# Patient Record
Sex: Male | Born: 1960 | Race: White | Hispanic: No | Marital: Married | Smoking: Never smoker
Health system: Southern US, Community
[De-identification: ages and names within clinical notes are randomized; demographics above are authoritative.]

## PROBLEM LIST (undated history)

## (undated) DIAGNOSIS — E7800 Pure hypercholesterolemia, unspecified: Secondary | ICD-10-CM

## (undated) DIAGNOSIS — G4730 Sleep apnea, unspecified: Secondary | ICD-10-CM

## (undated) DIAGNOSIS — M51369 Other intervertebral disc degeneration, lumbar region without mention of lumbar back pain or lower extremity pain: Secondary | ICD-10-CM

## (undated) DIAGNOSIS — M5136 Other intervertebral disc degeneration, lumbar region: Secondary | ICD-10-CM

## (undated) HISTORY — PX: OTHER SURGICAL HISTORY: SHX169

---

## 2013-02-27 ENCOUNTER — Ambulatory Visit: Payer: Self-pay | Admitting: Sports Medicine

## 2016-06-17 ENCOUNTER — Other Ambulatory Visit: Payer: Self-pay | Admitting: Neurology

## 2016-06-17 DIAGNOSIS — R270 Ataxia, unspecified: Secondary | ICD-10-CM

## 2016-07-08 ENCOUNTER — Ambulatory Visit
Admission: RE | Admit: 2016-07-08 | Discharge: 2016-07-08 | Disposition: A | Discharge status: Home / Self Care | Payer: BC Managed Care – PPO | Source: Ambulatory Visit | Attending: Neurology | Admitting: Neurology

## 2016-07-08 DIAGNOSIS — R270 Ataxia, unspecified: Secondary | ICD-10-CM | POA: Diagnosis not present

## 2016-07-08 DIAGNOSIS — M2578 Osteophyte, vertebrae: Secondary | ICD-10-CM | POA: Diagnosis not present

## 2016-07-08 DIAGNOSIS — R200 Anesthesia of skin: Secondary | ICD-10-CM | POA: Diagnosis present

## 2016-07-08 MED ORDER — GADOBENATE DIMEGLUMINE 529 MG/ML IV SOLN
20.0000 mL | Freq: Once | INTRAVENOUS | Status: AC | PRN
  Administered 2016-07-08: 20 mL via INTRAVENOUS

## 2020-03-24 ENCOUNTER — Ambulatory Visit: Payer: BC Managed Care – PPO | Attending: Internal Medicine

## 2020-03-24 ENCOUNTER — Other Ambulatory Visit: Payer: Self-pay

## 2020-03-24 DIAGNOSIS — Z23 Encounter for immunization: Secondary | ICD-10-CM

## 2020-03-24 NOTE — Progress Notes (Signed)
   Covid-19 Vaccination Clinic  Name:  Darren Reyes    MRN: [id number] DOB: 10-23-1961  03/24/2020  Mr. Darren Reyes was observed post Covid-19 immunization for 15 minutes without incident. He was provided with Vaccine Information Sheet and instruction to access the V-Safe system.   Mr. Darren Reyes was instructed to call 911 with any severe reactions post vaccine: Marland Kitchen Difficulty breathing  . Swelling of face and throat  . A fast heartbeat  . A bad rash all over body  . Dizziness and weakness   Immunizations Administered    Name Date Dose VIS Date Route   Pfizer COVID-19 Vaccine 03/24/2020 12:05 PM 0.3 mL 12/07/2019 Intramuscular   Manufacturer: [place]   Lot: [id number]   [place]: [id number]

## 2020-04-16 ENCOUNTER — Ambulatory Visit: Payer: BC Managed Care – PPO | Attending: Internal Medicine

## 2020-04-16 DIAGNOSIS — Z23 Encounter for immunization: Secondary | ICD-10-CM

## 2020-04-16 NOTE — Progress Notes (Signed)
   Covid-19 Vaccination Clinic  Name:  DANIE MAGNONE    MRN: [id number] DOB: May 16, 1961  04/16/2020  Mr. Brickler was observed post Covid-19 immunization for 15 minutes without incident. He was provided with Vaccine Information Sheet and instruction to access the V-Safe system.   Mr. Eustace was instructed to call 911 with any severe reactions post vaccine: Marland Kitchen Difficulty breathing  . Swelling of face and throat  . A fast heartbeat  . A bad rash all over body  . Dizziness and weakness   Immunizations Administered    Name Date Dose VIS Date Route   Pfizer COVID-19 Vaccine 04/16/2020  1:16 PM 0.3 mL 02/20/2019 Intramuscular   Manufacturer: [place]   Lot: [id number]   [place]: [id number]

## 2021-02-04 ENCOUNTER — Other Ambulatory Visit: Payer: Self-pay

## 2021-02-04 ENCOUNTER — Ambulatory Visit: Payer: BC Managed Care – PPO | Admitting: Dermatology

## 2021-02-04 DIAGNOSIS — L738 Other specified follicular disorders: Secondary | ICD-10-CM

## 2021-02-04 DIAGNOSIS — Z1283 Encounter for screening for malignant neoplasm of skin: Secondary | ICD-10-CM

## 2021-02-04 DIAGNOSIS — D229 Melanocytic nevi, unspecified: Secondary | ICD-10-CM

## 2021-02-04 DIAGNOSIS — L820 Inflamed seborrheic keratosis: Secondary | ICD-10-CM | POA: Diagnosis not present

## 2021-02-04 DIAGNOSIS — L821 Other seborrheic keratosis: Secondary | ICD-10-CM | POA: Diagnosis not present

## 2021-02-04 DIAGNOSIS — L578 Other skin changes due to chronic exposure to nonionizing radiation: Secondary | ICD-10-CM | POA: Diagnosis not present

## 2021-02-04 DIAGNOSIS — L814 Other melanin hyperpigmentation: Secondary | ICD-10-CM | POA: Diagnosis not present

## 2021-02-04 DIAGNOSIS — D1800 Hemangioma unspecified site: Secondary | ICD-10-CM

## 2021-02-04 DIAGNOSIS — L570 Actinic keratosis: Secondary | ICD-10-CM

## 2021-02-04 NOTE — Progress Notes (Signed)
   Follow-Up Visit   Subjective  Darren Reyes is a 60 y.o. male who presents for the following: Total body skin exam (No hx of skin ca, 1 yr f/u) and check spot (L cheek, 35m, irritated when shaving). The patient presents for Total-Body Skin Exam (TBSE) for skin cancer screening and mole check.  The following portions of the chart were reviewed this encounter and updated as appropriate:   Allergies  Meds  Problems  Med Hx  Surg Hx  Fam Hx     Review of Systems:  No other skin or systemic complaints except as noted in HPI or Assessment and Plan.  Objective  Well appearing patient in no apparent distress; mood and affect are within normal limits.  A full examination was performed including scalp, head, eyes, ears, nose, lips, neck, chest, axillae, abdomen, back, buttocks, bilateral upper extremities, bilateral lower extremities, hands, feet, fingers, toes, fingernails, and toenails. All findings within normal limits unless otherwise noted below.  Objective  L cheek x 1: Pink scaly macules   Objective  L sideburn x 1: Erythematous keratotic or waxy stuck-on papule or plaque.    Assessment & Plan    Lentigines - Scattered tan macules - Discussed due to sun exposure - Benign, observe - Call for any changes  Seborrheic Keratoses - Stuck-on, waxy, tan-brown papules and plaques  - Discussed benign etiology and prognosis. - Observe - Call for any changes  Melanocytic Nevi - Tan-brown and/or pink-flesh-colored symmetric macules and papules - Benign appearing on exam today - Observation - Call clinic for new or changing moles - Recommend daily use of broad spectrum spf 30+ sunscreen to sun-exposed areas.   Hemangiomas - Red papules - Discussed benign nature - Observe - Call for any changes  Actinic Damage - Chronic, secondary to cumulative UV/sun exposure - diffuse scaly erythematous macules with underlying dyspigmentation - Recommend daily broad spectrum sunscreen  SPF 30+ to sun-exposed areas, reapply every 2 hours as needed.  - Call for new or changing lesions.  Skin cancer screening performed today.  Sebaceous Hyperplasia - Small yellow papules with a central dell - Benign - Observe  AK (actinic keratosis) L cheek x 1  Destruction of lesion - L cheek x 1 Complexity: simple   Destruction method: cryotherapy   Informed consent: discussed and consent obtained   Timeout:  patient name, date of birth, surgical site, and procedure verified Lesion destroyed using liquid nitrogen: Yes   Region frozen until ice ball extended beyond lesion: Yes   Outcome: patient tolerated procedure well with no complications   Post-procedure details: wound care instructions given    Inflamed seborrheic keratosis L sideburn x 1  Destruction of lesion - L sideburn x 1 Complexity: simple   Destruction method: cryotherapy   Informed consent: discussed and consent obtained   Timeout:  patient name, date of birth, surgical site, and procedure verified Lesion destroyed using liquid nitrogen: Yes   Region frozen until ice ball extended beyond lesion: Yes   Outcome: patient tolerated procedure well with no complications   Post-procedure details: wound care instructions given    Skin cancer screening  Return in about 1 year (around 02/04/2022) for TBSE, hx of AK.  I, Othelia Pulling, RMA, am acting as scribe for Sarina Ser, MD .  Documentation: I have reviewed the above documentation for accuracy and completeness, and I agree with the above.  Sarina Ser, MD

## 2021-02-07 ENCOUNTER — Encounter: Payer: Self-pay | Admitting: Dermatology

## 2021-02-14 ENCOUNTER — Emergency Department: Payer: BC Managed Care – PPO

## 2021-02-14 ENCOUNTER — Encounter: Payer: Self-pay | Admitting: Intensive Care

## 2021-02-14 ENCOUNTER — Emergency Department
Admission: EM | Admit: 2021-02-14 | Discharge: 2021-02-14 | Disposition: A | Discharge status: Home / Self Care | Payer: BC Managed Care – PPO | Attending: Emergency Medicine | Admitting: Emergency Medicine

## 2021-02-14 ENCOUNTER — Other Ambulatory Visit: Payer: Self-pay

## 2021-02-14 DIAGNOSIS — M47817 Spondylosis without myelopathy or radiculopathy, lumbosacral region: Secondary | ICD-10-CM

## 2021-02-14 DIAGNOSIS — Y93F2 Activity, caregiving, lifting: Secondary | ICD-10-CM | POA: Insufficient documentation

## 2021-02-14 DIAGNOSIS — M5136 Other intervertebral disc degeneration, lumbar region: Secondary | ICD-10-CM | POA: Diagnosis not present

## 2021-02-14 DIAGNOSIS — S39012A Strain of muscle, fascia and tendon of lower back, initial encounter: Secondary | ICD-10-CM | POA: Insufficient documentation

## 2021-02-14 DIAGNOSIS — X500XXA Overexertion from strenuous movement or load, initial encounter: Secondary | ICD-10-CM | POA: Diagnosis not present

## 2021-02-14 DIAGNOSIS — S3992XA Unspecified injury of lower back, initial encounter: Secondary | ICD-10-CM | POA: Diagnosis present

## 2021-02-14 DIAGNOSIS — M5137 Other intervertebral disc degeneration, lumbosacral region: Secondary | ICD-10-CM

## 2021-02-14 DIAGNOSIS — M4696 Unspecified inflammatory spondylopathy, lumbar region: Secondary | ICD-10-CM | POA: Insufficient documentation

## 2021-02-14 MED ORDER — OXYCODONE-ACETAMINOPHEN 5-325 MG PO TABS
1.0000 | ORAL_TABLET | Freq: Once | ORAL | Status: AC
  Administered 2021-02-14: 1 via ORAL
  Filled 2021-02-14: qty 1

## 2021-02-14 MED ORDER — KETOROLAC TROMETHAMINE 10 MG PO TABS: 10.0000 mg | ORAL_TABLET | Freq: Three times a day (TID) | ORAL | 0 refills | Status: AC

## 2021-02-14 MED ORDER — ORPHENADRINE CITRATE 30 MG/ML IJ SOLN
60.0000 mg | INTRAMUSCULAR | Status: AC
  Administered 2021-02-14: 60 mg via INTRAMUSCULAR
  Filled 2021-02-14: qty 2

## 2021-02-14 MED ORDER — KETOROLAC TROMETHAMINE 30 MG/ML IJ SOLN
30.0000 mg | Freq: Once | INTRAMUSCULAR | Status: AC
  Administered 2021-02-14: 30 mg via INTRAMUSCULAR
  Filled 2021-02-14: qty 1

## 2021-02-14 MED ORDER — LIDOCAINE 5 % EX PTCH
1.0000 | MEDICATED_PATCH | Freq: Once | CUTANEOUS | Status: DC
  Administered 2021-02-14: 1 via TRANSDERMAL
  Filled 2021-02-14: qty 1

## 2021-02-14 MED ORDER — HYDROCODONE-ACETAMINOPHEN 5-325 MG PO TABS: 1.0000 | ORAL_TABLET | Freq: Three times a day (TID) | ORAL | 0 refills | Status: AC | PRN

## 2021-02-14 MED ORDER — CYCLOBENZAPRINE HCL 5 MG PO TABS: 5.0000 mg | ORAL_TABLET | Freq: Three times a day (TID) | ORAL | 0 refills | Status: AC | PRN

## 2021-02-14 MED ORDER — LIDOCAINE 5 % EX PTCH: 1.0000 | MEDICATED_PATCH | Freq: Two times a day (BID) | CUTANEOUS | 0 refills | Status: AC | PRN

## 2021-02-14 NOTE — ED Triage Notes (Signed)
Patient presents with lower back pain that started yesterday after hurting back lifting weights. Reports wife drove him to ER

## 2021-02-14 NOTE — ED Notes (Signed)
Pt to ED via POV c/o severe back pain that started yesterday after was lifting weights. Pt states that walking causes excruciating pain and does not want to get out of wheelchair.

## 2021-02-14 NOTE — ED Provider Notes (Signed)
[facility] Emergency Department Provider Note ____________________________________________  Time seen: 89  I have reviewed the triage vital signs and the nursing notes.  HISTORY  Chief Complaint  Back Pain  HPI Darren Reyes is a 61 y.o. male presents to the ED accompanied by his wife, for evaluation of acute midline low back pain.  Patient with a remote history of DDD and facet arthritis, presents after he was in the gym yesterday.  He describes doing chest presses with dumbbells on a bench, when he sat up to put the weights down, he had an immediate pain to the midline of the lower back.  He denies any referral of his pain, or any saddle anesthesias, foot drop, or bladder or bowel incontinence.  He completed a another rep  of the chest presses, before retiring home.  He presents today after increased midline low back pain and disability.  He describes pain is increased with attempts to stand up straight and extend lower back.  He again denies any distal paresthesias.  Patient is taken some ibuprofen with limited benefit.  He is concerned given his previous history of degenerative disc disease.  History reviewed. No pertinent past medical history.  There are no problems to display for this patient.  History reviewed. No pertinent surgical history.  Prior to Admission medications   Medication Sig Start Date End Date Taking? Authorizing Provider  cyclobenzaprine (FLEXERIL) 5 MG tablet Take 1 tablet (5 mg total) by mouth 3 (three) times daily as needed. 02/14/21  Yes Decorian Schuenemann, Dannielle Karvonen, PA-C  HYDROcodone-acetaminophen (NORCO) 5-325 MG tablet Take 1 tablet by mouth 3 (three) times daily as needed for up to 5 days. 02/14/21 02/19/21 Yes Alizea Pell, Dannielle Karvonen, PA-C  ketorolac (TORADOL) 10 MG tablet Take 1 tablet (10 mg total) by mouth every 8 (eight) hours. 02/14/21  Yes Betha Shadix, Dannielle Karvonen, PA-C  lidocaine (LIDODERM) 5 % Place 1 patch onto the skin every 12  (twelve) hours as needed for up to 10 days. Remove & Discard patch after 12 hours of wear each day. 02/14/21 02/24/21 Yes Shakendra Griffeth, Dannielle Karvonen, PA-C    Allergies Patient has no known allergies.  History reviewed. No pertinent family history.  Social History Social History   Tobacco Use  . Smoking status: Never Smoker  . Smokeless tobacco: Current User    Types: Snuff  Substance Use Topics  . Alcohol use: Yes  . Drug use: Never    Review of Systems  Constitutional: Negative for fever. Eyes: Negative for visual changes. ENT: Negative for sore throat. Cardiovascular: Negative for chest pain. Respiratory: Negative for shortness of breath. Gastrointestinal: Negative for abdominal pain, vomiting and diarrhea. Genitourinary: Negative for dysuria. Musculoskeletal: Positive for back pain. Skin: Negative for rash. Neurological: Negative for headaches, focal weakness or numbness. ____________________________________________  PHYSICAL EXAM:  VITAL SIGNS: ED Triage Vitals  Enc Vitals Group     BP 02/14/21 1753 136/87     Pulse Rate 02/14/21 1753 73     Resp 02/14/21 1753 16     Temp 02/14/21 1753 97.6 F (36.4 C)     Temp Source 02/14/21 1753 Oral     SpO2 02/14/21 1753 100 %     Weight 02/14/21 1751 187 lb (84.8 kg)     Height 02/14/21 1751 6\' 1"  (1.854 m)     Head Circumference --      Peak Flow --      Pain Score 02/14/21 1751 10  Pain Loc --      Pain Edu? --      Excl. in [place]? --     Constitutional: Alert and oriented. Well appearing and in no distress. Head: Normocephalic and atraumatic. Eyes: Conjunctivae are normal. Normal extraocular movements Cardiovascular: Normal rate, regular rhythm. Normal distal pulses. Respiratory: Normal respiratory effort. No wheezes/rales/rhonchi. Gastrointestinal: Soft and nontender. No distention. Musculoskeletal: Spinal alignment without significant midline tenderness, spasm, vomiting, or step-off.  Patient is tender to  palpation to the lumbar sacral junction in the midline.  Nontender with normal range of motion in all extremities.  Neurologic: Cranial nerves II through XII grossly intact.  Normal LE DTRs bilaterally.  Normal toe dorsiflexion and foot eversion on exam.  Negative supine straight leg raise on the right.  Normal gait without ataxia. Normal speech and language. No gross focal neurologic deficits are appreciated. Skin:  Skin is warm, dry and intact. No rash noted. Psychiatric: Mood and affect are normal. Patient exhibits appropriate insight and judgment. ____________________________________________   RADIOLOGY  CT Lumbar Spine w/o CM  IMPRESSION: 1. No acute or traumatic finding. 2. Lower lumbar degenerative disc disease and degenerative facet disease. Findings are most pronounced at L4-5 where there is narrowing of the lateral recesses that could possibly cause neural compression. The facet arthropathy is most pronounced at this level, which could contribute to back pain or referred facet syndrome pain. There could also be anterolisthesis with standing or flexion. 3. The facet arthropathy is also potentially significant particularly at L3-4 and possibly at L2-3 and L5-S1. ____________________________________________  PROCEDURES  Toradol 60 mg IM Norflex 60 mg IM Percocet 5-325 mg PO Lidoderm 5% patch  Procedures ____________________________________________  INITIAL IMPRESSION / ASSESSMENT AND PLAN / ED COURSE  DDX: lumbar strain, HNP, radiculopathy, facet arthropathy  Patient with a remote history of lumbar radiculopathy, presents after acute injury to his low back.  He was concern for worsening of his underlying DDD.  CT scans revealed multilevel DDD and facet arthropathy, with some mild spinal stenosis at the L4-5 region.  Patient otherwise with a benign exam without signs of any acute neuromuscular deficit, or any red flags.  His pain is localized to the midline without referral  or radiculopathy.  Patient reports improvement of his pain after ED medication administration.  He is advised to follow-up with his primary provider or with neurology for ongoing symptoms.  Prescriptions for ketorolac, Flexeril, hydrocodone, and Lidoderm patches are provided.  He is referred to [place] neurosurgery, at his request, for ongoing management.   Darren Reyes was evaluated in Emergency Department on 02/15/2021 for the symptoms described in the history of present illness. He was evaluated in the context of the global COVID-19 pandemic, which necessitated consideration that the patient might be at risk for infection with the SARS-CoV-2 virus that causes COVID-19. Institutional protocols and algorithms that pertain to the evaluation of patients at risk for COVID-19 are in a state of rapid change based on information released by regulatory bodies including the CDC and federal and state organizations. These policies and algorithms were followed during the patient's care in the ED.  I reviewed the patient's prescription history over the last 12 months in the multi-state controlled substances database(s) that includes [place], [place], [place], [place], [place], [place], [place], [place], [place], [place], [place], [place], [place], and [place].  Results were notable for neurosurgery for ongoing management. ____________________________________________  FINAL CLINICAL IMPRESSION(S) / ED DIAGNOSES  Final diagnoses:  Strain of lumbar region,  initial encounter  DDD (degenerative disc disease), lumbosacral  Facet arthropathy, lumbosacral      Jhoan Schmieder, Dannielle Karvonen, PA-C 02/15/21 0010    Blake Divine, MD 02/15/21 [phone]

## 2021-02-14 NOTE — Discharge Instructions (Addendum)
Your exam and CT scan are consistent with moderate multilevel degenerative disc disease and facet arthropathy.  Take the prescription medications as provided.  Follow-up with Dr. Arnoldo Morale or your PCP for ongoing symptoms peer return to the ED if needed.

## 2021-02-23 ENCOUNTER — Other Ambulatory Visit: Payer: Self-pay | Admitting: Orthopedic Surgery

## 2021-02-23 DIAGNOSIS — M25512 Pain in left shoulder: Secondary | ICD-10-CM

## 2021-02-28 ENCOUNTER — Ambulatory Visit
Admission: RE | Admit: 2021-02-28 | Discharge: 2021-02-28 | Disposition: A | Discharge status: Home / Self Care | Payer: BC Managed Care – PPO | Source: Ambulatory Visit | Attending: Orthopedic Surgery | Admitting: Orthopedic Surgery

## 2021-02-28 ENCOUNTER — Other Ambulatory Visit: Payer: Self-pay

## 2021-02-28 DIAGNOSIS — M25512 Pain in left shoulder: Secondary | ICD-10-CM

## 2022-02-10 ENCOUNTER — Other Ambulatory Visit: Payer: Self-pay

## 2022-02-10 ENCOUNTER — Ambulatory Visit: Payer: BC Managed Care – PPO | Admitting: Dermatology

## 2022-02-10 DIAGNOSIS — D1800 Hemangioma unspecified site: Secondary | ICD-10-CM

## 2022-02-10 DIAGNOSIS — Z1283 Encounter for screening for malignant neoplasm of skin: Secondary | ICD-10-CM | POA: Diagnosis not present

## 2022-02-10 DIAGNOSIS — D492 Neoplasm of unspecified behavior of bone, soft tissue, and skin: Secondary | ICD-10-CM

## 2022-02-10 DIAGNOSIS — D229 Melanocytic nevi, unspecified: Secondary | ICD-10-CM

## 2022-02-10 DIAGNOSIS — L820 Inflamed seborrheic keratosis: Secondary | ICD-10-CM | POA: Diagnosis not present

## 2022-02-10 DIAGNOSIS — L821 Other seborrheic keratosis: Secondary | ICD-10-CM

## 2022-02-10 DIAGNOSIS — L578 Other skin changes due to chronic exposure to nonionizing radiation: Secondary | ICD-10-CM

## 2022-02-10 DIAGNOSIS — C4491 Basal cell carcinoma of skin, unspecified: Secondary | ICD-10-CM

## 2022-02-10 DIAGNOSIS — L814 Other melanin hyperpigmentation: Secondary | ICD-10-CM

## 2022-02-10 HISTORY — DX: Basal cell carcinoma of skin, unspecified: C44.91

## 2022-02-10 NOTE — Patient Instructions (Addendum)

## 2022-02-10 NOTE — Progress Notes (Signed)
Follow-Up Visit   Subjective  Darren Reyes is a 61 y.o. male who presents for the following: Annual Exam (Mole check ). The patient presents for Total-Body Skin Exam (TBSE) for skin cancer screening and mole check.  The patient has spots, moles and lesions to be evaluated, some may be new or changing and the patient has concerns that these could be cancer.   The following portions of the chart were reviewed this encounter and updated as appropriate:   Tobacco   Allergies   Meds   Problems   Med Hx   Surg Hx   Fam Hx      Review of Systems:  No other skin or systemic complaints except as noted in HPI or Assessment and Plan.  Objective  Well appearing patient in no apparent distress; mood and affect are within normal limits.  A full examination was performed including scalp, head, eyes, ears, nose, lips, neck, chest, axillae, abdomen, back, buttocks, bilateral upper extremities, bilateral lower extremities, hands, feet, fingers, toes, fingernails, and toenails. All findings within normal limits unless otherwise noted below.  right lower eyelid margin x 2 (2) Stuck-on, waxy, tan-brown papule   right cheek preauricular 0.8 cm crusted papule      left forearm x 1 Stuck-on, waxy, tan-brown papule   Assessment & Plan  Inflamed seborrheic keratosis (2) right lower eyelid margin x 2  Destruction of lesion - right lower eyelid margin x 2 Complexity: simple   Destruction method: cryotherapy   Informed consent: discussed and consent obtained   Timeout:  patient name, date of birth, surgical site, and procedure verified Lesion destroyed using liquid nitrogen: Yes   Region frozen until ice ball extended beyond lesion: Yes   Outcome: patient tolerated procedure well with no complications   Post-procedure details: wound care instructions given    Neoplasm of skin right cheek preauricular  Skin / nail biopsy Type of biopsy: tangential   Informed consent: discussed and consent  obtained   Patient was prepped and draped in usual sterile fashion: Area prepped with alcohol. Anesthesia: the lesion was anesthetized in a standard fashion   Anesthetic:  1% lidocaine w/ epinephrine 1-100,000 buffered w/ 8.4% NaHCO3 Instrument used: flexible razor blade   Hemostasis achieved with: pressure, aluminum chloride and electrodesiccation   Outcome: patient tolerated procedure well   Post-procedure details: wound care instructions given   Post-procedure details comment:  Ointment and small bandage applied  Specimen 1 - Surgical pathology Differential Diagnosis: R/O BCC  Check Margins: No  Recommend surgery if biopsy confirm skin cancer   Seborrheic keratosis, inflamed left forearm x 1  Reassured benign age-related growth.  Recommend observation.  Discussed cryotherapy if spot(s) become irritated or inflamed.   Destruction of lesion - left forearm x 1 Complexity: simple   Destruction method: cryotherapy   Informed consent: discussed and consent obtained   Timeout:  patient name, date of birth, surgical site, and procedure verified Lesion destroyed using liquid nitrogen: Yes   Region frozen until ice ball extended beyond lesion: Yes   Outcome: patient tolerated procedure well with no complications   Post-procedure details: wound care instructions given    Skin cancer screening  Lentigines - Scattered tan macules - Due to sun exposure - Benign-appearing, observe - Recommend daily broad spectrum sunscreen SPF 30+ to sun-exposed areas, reapply every 2 hours as needed. - Call for any changes  Seborrheic Keratoses - Stuck-on, waxy, tan-brown papules and/or plaques  - Benign-appearing - Discussed benign etiology  and prognosis. - Observe - Call for any changes  Melanocytic Nevi - Tan-brown and/or pink-flesh-colored symmetric macules and papules - Benign appearing on exam today - Observation - Call clinic for new or changing moles - Recommend daily use of broad  spectrum spf 30+ sunscreen to sun-exposed areas.   Hemangiomas - Red papules - Discussed benign nature - Observe - Call for any changes  Actinic Damage - Chronic condition, secondary to cumulative UV/sun exposure - diffuse scaly erythematous macules with underlying dyspigmentation - Recommend daily broad spectrum sunscreen SPF 30+ to sun-exposed areas, reapply every 2 hours as needed.  - Staying in the shade or wearing long sleeves, sun glasses (UVA+UVB protection) and wide brim hats (4-inch brim around the entire circumference of the hat) are also recommended for sun protection.  - Call for new or changing lesions.  Skin cancer screening performed today.   Return in about 1 year (around 02/10/2023) for TBSE, hx of Aks .  IMarye Round, CMA, am acting as scribe for Sarina Ser, MD .  Documentation: I have reviewed the above documentation for accuracy and completeness, and I agree with the above.  Sarina Ser, MD

## 2022-02-11 ENCOUNTER — Encounter: Payer: Self-pay | Admitting: Gastroenterology

## 2022-02-11 NOTE — H&P (Signed)
Pre-Procedure H&P   Patient ID: Darren Reyes is a 61 y.o. male.  Gastroenterology Provider: Annamaria Helling, DO  Referring Provider: Dr. Kary Kos PCP: [place] Pink, MD  Date: 02/12/2022  HPI Darren Reyes is a 61 y.o. male who presents today for being right for screening colonoscopy.  Patient with normal bowel movements without melena hematochezia diarrhea or constipation.  Colonoscopy in 2012 only demonstrating left-sided diverticulosis and hyperplastic polyps.  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  Current smokeless tobacco user.  Most recent labs hemoglobin 15.5 MCV 94 platelets 222,000 creatinine 1.1.   Past Medical History:  Diagnosis Date   DDD (degenerative disc disease), lumbar    Hypercholesteremia     Past Surgical History:  Procedure Laterality Date   left shoulder surgery Left    replacement    Family History No h/o GI disease or malignancy  Review of Systems  Constitutional:  Negative for activity change, appetite change, chills, diaphoresis, fatigue, fever and unexpected weight change.  HENT:  Negative for trouble swallowing and voice change.   Respiratory:  Negative for shortness of breath and wheezing.   Cardiovascular:  Negative for chest pain, palpitations and leg swelling.  Gastrointestinal:  Negative for abdominal distention, abdominal pain, anal bleeding, blood in stool, constipation, diarrhea, nausea and vomiting.  Musculoskeletal:  Negative for arthralgias and myalgias.  Skin:  Negative for color change and pallor.  Neurological:  Negative for dizziness, syncope and weakness.  Psychiatric/Behavioral:  Negative for confusion. The patient is not nervous/anxious.   All other systems reviewed and are negative.   Medications No current facility-administered medications on file prior to encounter.   Current Outpatient Medications on File Prior to Encounter  Medication Sig Dispense Refill   cyclobenzaprine  (FLEXERIL) 5 MG tablet Take 1 tablet (5 mg total) by mouth 3 (three) times daily as needed. (Patient not taking: Reported on 02/12/2022) 15 tablet 0   ketorolac (TORADOL) 10 MG tablet Take 1 tablet (10 mg total) by mouth every 8 (eight) hours. (Patient not taking: Reported on 02/12/2022) 15 tablet 0    Pertinent medications related to GI and procedure were reviewed by me with the patient prior to the procedure   Current Facility-Administered Medications:    0.9 %  sodium chloride infusion, , Intravenous, Continuous, Annamaria Helling, DO      No Known Allergies Allergies were reviewed by me prior to the procedure  Objective    Vitals:   02/12/22 1116  BP: 111/69  Pulse: 81  Resp: 20  Temp: 97.7 F (36.5 C)  TempSrc: Temporal  SpO2: 100%  Weight: 88 kg  Height: 6\' 1"  (1.854 m)     Physical Exam Vitals and nursing note reviewed.  Constitutional:      General: He is not in acute distress.    Appearance: Normal appearance. He is not ill-appearing, toxic-appearing or diaphoretic.  HENT:     Head: Normocephalic and atraumatic.     Nose: Nose normal.     Mouth/Throat:     Mouth: Mucous membranes are moist.     Pharynx: Oropharynx is clear.  Eyes:     General: No scleral icterus.    Extraocular Movements: Extraocular movements intact.  Cardiovascular:     Rate and Rhythm: Normal rate and regular rhythm.     Heart sounds: Normal heart sounds. No murmur heard.   No friction rub. No gallop.  Pulmonary:     Effort: Pulmonary effort is normal. No respiratory  distress.     Breath sounds: Normal breath sounds. No wheezing, rhonchi or rales.  Abdominal:     General: Bowel sounds are normal. There is no distension.     Palpations: Abdomen is soft.     Tenderness: There is no abdominal tenderness. There is no guarding or rebound.  Musculoskeletal:     Cervical back: Neck supple.     Right lower leg: No edema.     Left lower leg: No edema.  Skin:    General: Skin is warm  and dry.     Coloration: Skin is not jaundiced or pale.  Neurological:     General: No focal deficit present.     Mental Status: He is alert and oriented to person, place, and time. Mental status is at baseline.  Psychiatric:        Mood and Affect: Mood normal.        Behavior: Behavior normal.        Thought Content: Thought content normal.        Judgment: Judgment normal.     Assessment:  Darren Reyes is a 61 y.o. male  who presents today for Colonoscopy for screening colonoscopy.  Plan:  Colonoscopy with possible intervention today  Colonoscopy with possible biopsy, control of bleeding, polypectomy, and interventions as necessary has been discussed with the patient/patient representative. Informed consent was obtained from the patient/patient representative after explaining the indication, nature, and risks of the procedure including but not limited to death, bleeding, perforation, missed neoplasm/lesions, cardiorespiratory compromise, and reaction to medications. Opportunity for questions was given and appropriate answers were provided. Patient/patient representative has verbalized understanding is amenable to undergoing the procedure.   Annamaria Helling, DO  [facility]  Portions of the record may have been created with voice recognition software. Occasional wrong-word or 'sound-a-like' substitutions may have occurred due to the inherent limitations of voice recognition software.  Read the chart carefully and recognize, using context, where substitutions may have occurred.

## 2022-02-12 ENCOUNTER — Ambulatory Visit: Payer: BC Managed Care – PPO | Admitting: Certified Registered"

## 2022-02-12 ENCOUNTER — Encounter
Admission: RE | Disposition: A | Discharge status: Home / Self Care | Payer: Self-pay | Source: Ambulatory Visit | Attending: Gastroenterology

## 2022-02-12 ENCOUNTER — Other Ambulatory Visit: Payer: Self-pay

## 2022-02-12 ENCOUNTER — Encounter: Payer: Self-pay | Admitting: Gastroenterology

## 2022-02-12 ENCOUNTER — Ambulatory Visit
Admission: RE | Admit: 2022-02-12 | Discharge: 2022-02-12 | Disposition: A | Discharge status: Home / Self Care | Payer: BC Managed Care – PPO | Source: Ambulatory Visit | Attending: Gastroenterology | Admitting: Gastroenterology

## 2022-02-12 DIAGNOSIS — Z96612 Presence of left artificial shoulder joint: Secondary | ICD-10-CM | POA: Diagnosis not present

## 2022-02-12 DIAGNOSIS — K621 Rectal polyp: Secondary | ICD-10-CM | POA: Diagnosis not present

## 2022-02-12 DIAGNOSIS — Z86010 Personal history of colonic polyps: Secondary | ICD-10-CM | POA: Insufficient documentation

## 2022-02-12 DIAGNOSIS — Z1211 Encounter for screening for malignant neoplasm of colon: Secondary | ICD-10-CM | POA: Diagnosis not present

## 2022-02-12 DIAGNOSIS — F17290 Nicotine dependence, other tobacco product, uncomplicated: Secondary | ICD-10-CM | POA: Insufficient documentation

## 2022-02-12 DIAGNOSIS — K5730 Diverticulosis of large intestine without perforation or abscess without bleeding: Secondary | ICD-10-CM | POA: Insufficient documentation

## 2022-02-12 HISTORY — DX: Other intervertebral disc degeneration, lumbar region without mention of lumbar back pain or lower extremity pain: M51.369

## 2022-02-12 HISTORY — PX: COLONOSCOPY WITH PROPOFOL: SHX5780

## 2022-02-12 HISTORY — DX: Sleep apnea, unspecified: G47.30

## 2022-02-12 HISTORY — DX: Pure hypercholesterolemia, unspecified: E78.00

## 2022-02-12 HISTORY — DX: Other intervertebral disc degeneration, lumbar region: M51.36

## 2022-02-12 SURGERY — COLONOSCOPY WITH PROPOFOL
Anesthesia: General

## 2022-02-12 MED ORDER — LIDOCAINE HCL (CARDIAC) PF 100 MG/5ML IV SOSY
PREFILLED_SYRINGE | INTRAVENOUS | Status: DC | PRN
  Administered 2022-02-12: 100 mg via INTRAVENOUS

## 2022-02-12 MED ORDER — PROPOFOL 10 MG/ML IV BOLUS
INTRAVENOUS | Status: AC
  Filled 2022-02-12: qty 20

## 2022-02-12 MED ORDER — SODIUM CHLORIDE 0.9 % IV SOLN: INTRAVENOUS | Status: DC

## 2022-02-12 MED ORDER — PROPOFOL 500 MG/50ML IV EMUL
INTRAVENOUS | Status: DC | PRN
Start: 2022-02-12 — End: 2022-02-12
  Administered 2022-02-12: 150 ug/kg/min via INTRAVENOUS

## 2022-02-12 MED ORDER — PROPOFOL 10 MG/ML IV BOLUS
INTRAVENOUS | Status: DC | PRN
  Administered 2022-02-12: 100 mg via INTRAVENOUS

## 2022-02-12 NOTE — Anesthesia Preprocedure Evaluation (Signed)
Anesthesia Evaluation  Patient identified by MRN, date of birth, ID band Patient awake    Reviewed: Allergy & Precautions, NPO status , Patient's Chart, lab work & pertinent test results  History of Anesthesia Complications Negative for: history of anesthetic complications  Airway Mallampati: II  TM Distance: >3 FB Neck ROM: Full    Dental no notable dental hx. (+) Teeth Intact, Implants   Pulmonary neg pulmonary ROS, neg sleep apnea, neg COPD, Patient abstained from smoking.Not current smoker,    Pulmonary exam normal breath sounds clear to auscultation       Cardiovascular Exercise Tolerance: Good METS(-) hypertension(-) CAD and (-) Past MI negative cardio ROS  (-) dysrhythmias  Rhythm:Regular Rate:Normal - Systolic murmurs    Neuro/Psych negative neurological ROS  negative psych ROS   GI/Hepatic neg GERD  ,(+)     (-) substance abuse  ,   Endo/Other  neg diabetes  Renal/GU negative Renal ROS     Musculoskeletal  (+) Arthritis ,   Abdominal   Peds  Hematology   Anesthesia Other Findings Past Medical History: No date: DDD (degenerative disc disease), lumbar No date: Hypercholesteremia  Reproductive/Obstetrics                             Anesthesia Physical Anesthesia Plan  ASA: 2  Anesthesia Plan: General   Post-op Pain Management: Minimal or no pain anticipated   Induction: Intravenous  PONV Risk Score and Plan: 2 and Propofol infusion, TIVA and Ondansetron  Airway Management Planned: Nasal Cannula  Additional Equipment: None  Intra-op Plan:   Post-operative Plan:   Informed Consent: I have reviewed the patients History and Physical, chart, labs and discussed the procedure including the risks, benefits and alternatives for the proposed anesthesia with the patient or authorized representative who has indicated his/her understanding and acceptance.     Dental  advisory given  Plan Discussed with: CRNA and Surgeon  Anesthesia Plan Comments: (Discussed risks of anesthesia with patient, including possibility of difficulty with spontaneous ventilation under anesthesia necessitating airway intervention, PONV, and rare risks such as cardiac or respiratory or neurological events, and allergic reactions. Discussed the role of CRNA in patient's perioperative care. Patient understands.)        Anesthesia Quick Evaluation

## 2022-02-12 NOTE — Interval H&P Note (Signed)
History and Physical Interval Note: Preprocedure H&P from 02/12/22  was reviewed and there was no interval change after seeing and examining the patient.  Written consent was obtained from the patient after discussion of risks, benefits, and alternatives. Patient has consented to proceed with Colonoscopy with possible intervention   02/12/2022 11:27 AM  Darren Reyes  has presented today for surgery, with the diagnosis of colon cancer screening.  The various methods of treatment have been discussed with the patient and family. After consideration of risks, benefits and other options for treatment, the patient has consented to  Procedure(s): COLONOSCOPY WITH PROPOFOL (N/A) as a surgical intervention.  The patient's history has been reviewed, patient examined, no change in status, stable for surgery.  I have reviewed the patient's chart and labs.  Questions were answered to the patient's satisfaction.     Annamaria Helling

## 2022-02-12 NOTE — Op Note (Signed)
[facility] Gastroenterology Patient Name: Darren Reyes Procedure Date: 02/12/2022 11:45 AM MRN: [id number] Account #: [id number] Date of Birth: 07-25-1961 Admit Type: Outpatient Age: 61 Room: [facility] ENDO ROOM 1 Gender: Male Note Status: Finalized Instrument Name: Colonoscope [id number] Procedure:             Colonoscopy Indications:           Screening for colorectal malignant neoplasm Providers:             Rueben Bash, DO Referring MD:          Irven Easterly. Kary Kos, MD (Referring MD) Medicines:             Monitored Anesthesia Care Complications:         No immediate complications. Estimated blood loss:                         Minimal. Procedure:             Pre-Anesthesia Assessment:                        - Prior to the procedure, a History and Physical was                         performed, and patient medications and allergies were                         reviewed. The patient is competent. The risks and                         benefits of the procedure and the sedation options and                         risks were discussed with the patient. All questions                         were answered and informed consent was obtained.                         Patient identification and proposed procedure were                         verified by the physician, the nurse, the anesthetist                         and the technician in the endoscopy suite. Mental                         Status Examination: alert and oriented. Airway                         Examination: normal oropharyngeal airway and neck                         mobility. Respiratory Examination: clear to                         auscultation. CV Examination: RRR, no murmurs, no S3  or S4. Prophylactic Antibiotics: The patient does not                         require prophylactic antibiotics. Prior                         Anticoagulants: The patient has taken no previous                          anticoagulant or antiplatelet agents. ASA Grade                         Assessment: II - A patient with mild systemic disease.                         After reviewing the risks and benefits, the patient                         was deemed in satisfactory condition to undergo the                         procedure. The anesthesia plan was to use monitored                         anesthesia care (MAC). Immediately prior to                         administration of medications, the patient was                         re-assessed for adequacy to receive sedatives. The                         heart rate, respiratory rate, oxygen saturations,                         blood pressure, adequacy of pulmonary ventilation, and                         response to care were monitored throughout the                         procedure. The physical status of the patient was                         re-assessed after the procedure.                        After obtaining informed consent, the colonoscope was                         passed under direct vision. Throughout the procedure,                         the patient's blood pressure, pulse, and oxygen                         saturations were monitored continuously. The  Colonoscope was introduced through the anus and                         advanced to the the terminal ileum, with                         identification of the appendiceal orifice and IC                         valve. The colonoscopy was performed without                         difficulty. The patient tolerated the procedure well.                         The quality of the bowel preparation was evaluated                         using the BBPS [facility] Bowel Preparation Scale) with                         scores of: Right Colon = 2 (minor amount of residual                         staining, small fragments of stool and/or opaque                          liquid, but mucosa seen well), Transverse Colon = 3                         (entire mucosa seen well with no residual staining,                         small fragments of stool or opaque liquid) and Left                         Colon = 3 (entire mucosa seen well with no residual                         staining, small fragments of stool or opaque liquid).                         The total BBPS score equals 8. The quality of the                         bowel preparation was excellent. The terminal ileum,                         ileocecal valve, appendiceal orifice, and rectum were                         photographed. Findings:      The perianal and digital rectal examinations were normal. Pertinent       negatives include normal sphincter tone.      The terminal ileum appeared normal. Estimated blood loss: none.      Three sessile polyps were found in the rectum and cecum (  1). The polyps       were 1 to 2 mm in size. These polyps were removed with a cold biopsy       forceps. Resection and retrieval were complete. Estimated blood loss was       minimal.      A 4 to 6 mm polyp was found in the rectum. The polyp was sessile. The       polyp was removed with a cold snare. Resection and retrieval were       complete. Estimated blood loss was minimal.      The exam was otherwise without abnormality on direct and retroflexion       views. Impression:            - The examined portion of the ileum was normal.                        - Three 1 to 2 mm polyps in the rectum and in the                         cecum, removed with a cold biopsy forceps. Resected                         and retrieved.                        - One 4 to 6 mm polyp in the rectum, removed with a                         cold snare. Resected and retrieved.                        - The examination was otherwise normal on direct and                         retroflexion views. Recommendation:        - Discharge patient to  home.                        - Resume previous diet.                        - No aspirin, ibuprofen, naproxen, or other                         non-steroidal anti-inflammatory drugs for 5 days after                         polyp removal.                        - Continue present medications.                        - Await pathology results.                        - Repeat colonoscopy for surveillance based on                         pathology results.                        -  Return to referring physician as previously                         scheduled.                        - The findings and recommendations were discussed with                         the patient. Procedure Code(s):     --- Professional ---                        [phone], Colonoscopy, flexible; with removal of                         tumor(s), polyp(s), or other lesion(s) by snare                         technique                        [id number], 60, Colonoscopy, flexible; with biopsy, single                         or multiple Diagnosis Code(s):     --- Professional ---                        Z12.11, Encounter for screening for malignant neoplasm                         of colon                        K62.1, Rectal polyp                        K63.5, Polyp of colon CPT copyright 2019 [employer]. All rights reserved. The codes documented in this report are preliminary and upon coder review may  be revised to meet current compliance requirements. Attending Participation:      I personally performed the entire procedure. Volney American, DO Annamaria Helling DO, DO 02/12/2022 12:18:33 PM This report has been signed electronically. Number of Addenda: 0 Note Initiated On: 02/12/2022 11:45 AM Scope Withdrawal Time: 0 hours 18 minutes 23 seconds  Total Procedure Duration: 0 hours 23 minutes 2 seconds  Estimated Blood Loss:  Estimated blood loss was minimal.      [facility]

## 2022-02-12 NOTE — Transfer of Care (Signed)
Immediate Anesthesia Transfer of Care Note  Patient: Darren Reyes  Procedure(s) Performed: COLONOSCOPY WITH PROPOFOL  Patient Location: PACU and Endoscopy Unit  Anesthesia Type:General  Level of Consciousness: drowsy  Airway & Oxygen Therapy: Patient Spontanous Breathing  Post-op Assessment: Report given to RN  Post vital signs: stable  Last Vitals:  Vitals Value Taken Time  BP 82/58 02/12/22 1218  Temp 35.9 C 02/12/22 1217  Pulse 66 02/12/22 1218  Resp 10 02/12/22 1218  SpO2 92 % 02/12/22 1218    Last Pain:  Vitals:   02/12/22 1217  TempSrc: Temporal  PainSc: Asleep         Complications: No notable events documented.

## 2022-02-12 NOTE — Anesthesia Postprocedure Evaluation (Signed)
Anesthesia Post Note  Patient: Darren Reyes  Procedure(s) Performed: COLONOSCOPY WITH PROPOFOL  Patient location during evaluation: Endoscopy Anesthesia Type: General Level of consciousness: awake and alert Pain management: pain level controlled Vital Signs Assessment: post-procedure vital signs reviewed and stable Respiratory status: spontaneous breathing, nonlabored ventilation, respiratory function stable and patient connected to nasal cannula oxygen Cardiovascular status: blood pressure returned to baseline and stable Postop Assessment: no apparent nausea or vomiting Anesthetic complications: no   No notable events documented.   Last Vitals:  Vitals:   02/12/22 1247 02/12/22 1257  BP: 104/77 109/79  Pulse: (!) 52 (!) 52  Resp: 12 14  Temp:    SpO2: 99% 98%    Last Pain:  Vitals:   02/12/22 1257  TempSrc:   PainSc: 0-No pain                 Arita Miss

## 2022-02-13 ENCOUNTER — Encounter: Payer: Self-pay | Admitting: Dermatology

## 2022-02-15 ENCOUNTER — Encounter: Payer: Self-pay | Admitting: Gastroenterology

## 2022-02-15 ENCOUNTER — Telehealth: Payer: Self-pay

## 2022-02-15 LAB — SURGICAL PATHOLOGY

## 2022-02-15 NOTE — Telephone Encounter (Signed)
Called pt discussed biopsy results, scheduled surgery appt for 04/06/2022 at 8:30 am

## 2022-02-15 NOTE — Telephone Encounter (Signed)
-----   Message from Ralene Bathe, MD sent at 02/11/2022  5:37 PM EST ----- Diagnosis Skin , right cheek preauricular BASAL CELL CARCINOMA, NODULAR PATTERN  Cancer - BCC Schedule surgery

## 2022-04-06 ENCOUNTER — Ambulatory Visit: Payer: BC Managed Care – PPO | Admitting: Dermatology

## 2022-04-06 ENCOUNTER — Encounter: Payer: Self-pay | Admitting: Dermatology

## 2022-04-06 ENCOUNTER — Telehealth: Payer: Self-pay

## 2022-04-06 DIAGNOSIS — C44319 Basal cell carcinoma of skin of other parts of face: Secondary | ICD-10-CM | POA: Diagnosis not present

## 2022-04-06 MED ORDER — MUPIROCIN 2 % EX OINT: 1.0000 "application " | TOPICAL_OINTMENT | Freq: Every day | CUTANEOUS | 0 refills | Status: AC

## 2022-04-06 NOTE — Progress Notes (Signed)
? ?  Follow-Up Visit ?  ?Subjective  ?Darren Reyes is a 61 y.o. male who presents for the following: BCC bx proven (R cheek preauricular, pt presents for excision). ? ?The following portions of the chart were reviewed this encounter and updated as appropriate:  ? Tobacco  Allergies  Meds  Problems  Med Hx  Surg Hx  Fam Hx   ?  ?Review of Systems:  No other skin or systemic complaints except as noted in HPI or Assessment and Plan. ? ?Objective  ?Well appearing patient in no apparent distress; mood and affect are within normal limits. ? ?A focused examination was performed including face. Relevant physical exam findings are noted in the Assessment and Plan. ? ?R cheek preauricular ?Pink bx site 1.3 x 1.0cm ? ? ?Assessment & Plan  ?Basal cell carcinoma (BCC) of skin of other part of face ?R cheek preauricular ? ?mupirocin ointment (BACTROBAN) 2 % ?Apply 1 application. topically daily. Qd to excision site ? ?Skin excision ? ?Lesion length (cm):  1.3 ?Lesion width (cm):  1 ?Margin per side (cm):  0.2 ?Total excision diameter (cm):  1.7 ?Informed consent: discussed and consent obtained   ?Timeout: patient name, date of birth, surgical site, and procedure verified   ?Procedure prep:  Patient was prepped and draped in usual sterile fashion ?Prep type:  Isopropyl alcohol and povidone-iodine ?Anesthesia: the lesion was anesthetized in a standard fashion   ?Anesthetic:  1% lidocaine w/ epinephrine 1-100,000 buffered w/ 8.4% NaHCO3 (4.0cc lido w/ epi, 3cc bupivicaine, Total of 7cc) ?Instrument used comment:  #15c blade ?Hemostasis achieved with: pressure   ?Hemostasis achieved with comment:  Electrocautery ?Outcome: patient tolerated procedure well with no complications   ?Post-procedure details: sterile dressing applied and wound care instructions given   ?Dressing type: bandage and pressure dressing (Mupirocin)   ? ?Skin repair ?Complexity:  Complex ?Final length (cm):  4 ?Reason for type of repair: reduce tension to  allow closure, reduce the risk of dehiscence, infection, and necrosis, reduce subcutaneous dead space and avoid a hematoma, allow closure of the large defect, preserve normal anatomy, preserve normal anatomical and functional relationships and enhance both functionality and cosmetic results   ?Undermining: area extensively undermined   ?Undermining comment:  Undermining Defect 1.4cm ?Subcutaneous layers (deep stitches):  ?Suture size:  5-0 ?Suture type: Vicryl (polyglactin 910)   ?Subcutaneous suture technique: Inverted Dermal. ?Fine/surface layer approximation (top stitches):  ?Suture size:  5-0 ?Suture type: nylon   ?Stitches: simple running   ?Suture removal (days):  7 ?Hemostasis achieved with: pressure ?Outcome: patient tolerated procedure well with no complications   ?Post-procedure details: sterile dressing applied and wound care instructions given   ?Dressing type: bandage, pressure dressing and bacitracin (Mupirocin)   ? ?Specimen 1 - Surgical pathology ?Differential Diagnosis: Bx proven BCC ? ?Check Margins: yes ?Pink bx site 1.3 x 1.0cm ?[id number] ? ?Bx proven ?Excised today ?Start Mupirocin oint qd to excision site ? ? ?Return in about 1 week (around 04/13/2022) for suture removal. ? ?I, Othelia Pulling, RMA, am acting as scribe for Sarina Ser, MD . ?Documentation: I have reviewed the above documentation for accuracy and completeness, and I agree with the above. ? ?Sarina Ser, MD ? ?

## 2022-04-06 NOTE — Patient Instructions (Signed)
Wound Care Instructions ? ?On the day following your surgery, you should begin doing daily dressing changes: ?Remove the old dressing and discard it. ?Cleanse the wound gently with tap water. This may be done in the shower or by placing a wet gauze pad directly on the wound and letting it soak for several minutes. ?It is important to gently remove any dried blood from the wound in order to encourage healing. This may be done by gently rolling a moistened Q-tip on the dried blood. Do not pick at the wound. ?If the wound should start to bleed, continue cleaning the wound, then place a moist gauze pad on the wound and hold pressure for a few minutes.  ?Make sure you then dry the skin surrounding the wound completely or the tape will not stick to the skin. Do not use cotton balls on the wound. ?After the wound is clean and dry, apply the ointment gently with a Q-tip. ?Cut a non-stick pad to fit the size of the wound. Lay the pad flush to the wound. If the wound is draining, you may want to reinforce it with a small amount of gauze on top of the non-stick pad for a little added compression to the area. ?Use the tape to seal the area completely. ?Select from the following with respect to your individual situation: ?If your wound has been stitched closed: continue the above steps 1-8 at least daily until your sutures are removed. ?If your wound has been left open to heal: continue steps 1-8 at least daily for the first 3-4 weeks. ?We would like for you to take a few extra precautions for at least the next week. ?Sleep with your head elevated on pillows if our wound is on your head. ?Do not bend over or lift heavy items to reduce the chance of elevated blood pressure to the wound ?Do not participate in particularly strenuous activities. ? ? ?Below is a list of dressing supplies you might need.  ?Cotton-tipped applicators - Q-tips ?Gauze pads (2x2 and/or 4x4) - All-Purpose Sponges ?Non-stick dressing material - Telfa ?Tape -  Paper or Hypafix ?New and clean tube of petroleum jelly - Vaseline  ? ? ?Comments on Post-Operative Period ?Slight swelling and redness often appear around the wound. This is normal and will disappear within several days following the surgery. ?The healing wound will drain a brownish-red-yellow discharge during healing. This is a normal phase of wound healing. As the wound begins to heal, the drainage may increase in amount. Again, this drainage is normal. ?Notify us if the drainage becomes persistently bloody, excessively swollen, or intensely painful or develops a foul odor or red streaks.  ?If you should experience mild discomfort during the healing phase, you may take an aspirin-free medication such as Tylenol (acetaminophen). Notify us if the discomfort is severe or persistent. Avoid alcoholic beverages when taking pain medicine. ? ?In Case of Wound Hemorrhage ?A wound hemorrhage is when the bandage suddenly becomes soaked with bright red blood and flows profusely. If this happens, sit down or lie down with your head elevated. If the wound has a dressing on it, do not remove the dressing. Apply pressure to the existing gauze. If the wound is not covered, use a gauze pad to apply pressure and continue applying the pressure for 20 minutes without peeking. DO NOT COVER THE WOUND WITH A LARGE TOWEL OR WASH CLOTH. Release your hand from the wound site but do not remove the dressing. If the bleeding has stopped,   gently clean around the wound. Leave the dressing in place for 24 hours if possible. This wait time allows the blood vessels to close off so that you do not spark a new round of bleeding by disrupting the newly clotted blood vessels with an immediate dressing change. If the bleeding does not subside, continue to hold pressure. If matters are out of your control, contact an After Hours clinic or go to the Emergency Room. ? ?If You Need Anything After Your Visit ? ?If you have any questions or concerns for your  doctor, please call our main line at [phone] and press option 4 to reach your doctor's medical assistant. If no one answers, please leave a voicemail as directed and we will return your call as soon as possible. Messages left after 4 pm will be answered the following business day.  ? ?You may also send us a message via MyChart. We typically respond to MyChart messages within 1-2 business days. ? ?For prescription refills, please ask your pharmacy to contact our office. Our fax number is [phone]. ? ?If you have an urgent issue when the clinic is closed that cannot wait until the next business day, you can page your doctor at the number below.   ? ?Please note that while we do our best to be available for urgent issues outside of office hours, we are not available 24/7.  ? ?If you have an urgent issue and are unable to reach us, you may choose to seek medical care at your doctor's office, retail clinic, urgent care center, or emergency room. ? ?If you have a medical emergency, please immediately call 911 or go to the emergency department. ? ?Pager Numbers ? ?- Dr. Kowalski: [phone] ? ?- Dr. Moye: [phone] ? ?- Dr. Stewart: [phone] ? ?In the event of inclement weather, please call our main line at [phone] for an update on the status of any delays or closures. ? ?Dermatology Medication Tips: ?Please keep the boxes that topical medications come in in order to help keep track of the instructions about where and how to use these. Pharmacies typically print the medication instructions only on the boxes and not directly on the medication tubes.  ? ?If your medication is too expensive, please contact our office at [phone] option 4 or send us a message through MyChart.  ? ?We are unable to tell what your co-pay for medications will be in advance as this is different depending on your insurance coverage. However, we may be able to find a substitute medication at lower cost or fill out paperwork  to get insurance to cover a needed medication.  ? ?If a prior authorization is required to get your medication covered by your insurance company, please allow us 1-2 business days to complete this process. ? ?Drug prices often vary depending on where the prescription is filled and some pharmacies may offer cheaper prices. ? ?The website www.goodrx.com contains coupons for medications through different pharmacies. The prices here do not account for what the cost may be with help from insurance (it may be cheaper with your insurance), but the website can give you the price if you did not use any insurance.  ?- You can print the associated coupon and take it with your prescription to the pharmacy.  ?- You may also stop by our office during regular business hours and pick up a GoodRx coupon card.  ?- If you need your prescription sent electronically to a different pharmacy, notify our office through   [facility] MyChart or by phone at [phone] option 4. ? ? ? ? ?Si Usted Necesita Algo Despu?s de Su Visita ? ?Tambi?n puede enviarnos un mensaje a trav?s de MyChart. Por lo general respondemos a los mensajes de MyChart en el transcurso de 1 a 2 d?as h?biles. ? ?Para renovar recetas, por favor pida a su farmacia que se ponga en contacto con nuestra oficina. Nuestro n?mero de fax es el [phone]. ? ?Si tiene un asunto urgente cuando la cl?nica est? cerrada y que no puede esperar hasta el siguiente d?a h?bil, puede llamar/localizar a su doctor(a) al n?mero que aparece a continuaci?n.  ? ?Por favor, tenga en cuenta que aunque hacemos todo lo posible para estar disponibles para asuntos urgentes fuera del horario de oficina, no estamos disponibles las 24 horas del d?a, los 7 d?as de la semana.  ? ?Si tiene un problema urgente y no puede comunicarse con nosotros, puede optar por buscar atenci?n m?dica  en el consultorio de su doctor(a), en una cl?nica privada, en un centro de atenci?n urgente o en una sala de  emergencias. ? ?Si tiene una emergencia m?dica, por favor llame inmediatamente al 911 o vaya a la sala de emergencias. ? ?N?meros de b?per ? ?- Dr. Kowalski: [phone] ? ?- Dra. Moye: [phone] ? ?- Dra. St

## 2022-04-06 NOTE — Telephone Encounter (Signed)
Spoke to pt's wife and pt is doing fine after today's surgery./sh ?

## 2022-04-12 ENCOUNTER — Ambulatory Visit (INDEPENDENT_AMBULATORY_CARE_PROVIDER_SITE_OTHER): Payer: BC Managed Care – PPO | Admitting: Dermatology

## 2022-04-12 DIAGNOSIS — Z4802 Encounter for removal of sutures: Secondary | ICD-10-CM

## 2022-04-12 DIAGNOSIS — Z85828 Personal history of other malignant neoplasm of skin: Secondary | ICD-10-CM

## 2022-04-12 NOTE — Patient Instructions (Signed)

## 2022-04-13 ENCOUNTER — Encounter: Payer: Self-pay | Admitting: Dermatology

## 2022-04-21 ENCOUNTER — Encounter: Payer: Self-pay | Admitting: Dermatology

## 2022-04-21 NOTE — Progress Notes (Signed)
? ?  Follow-Up Visit ?  ?Subjective  ?Darren Reyes is a 61 y.o. male who presents for the following: Follow-up (Post op - Biopsy proven BCC - Margins free of right cheek preauricular ). ? ?The following portions of the chart were reviewed this encounter and updated as appropriate:  ? Tobacco  Allergies  Meds  Problems  Med Hx  Surg Hx  Fam Hx   ?  ?Review of Systems:  No other skin or systemic complaints except as noted in HPI or Assessment and Plan. ? ?Objective  ?Well appearing patient in no apparent distress; mood and affect are within normal limits. ? ?A focused examination was performed including face. Relevant physical exam findings are noted in the Assessment and Plan. ? ?Right cheek reauricular ?Healing excision site ? ? ?Assessment & Plan  ?History of basal cell carcinoma (BCC) ?Right cheek reauricular ? ?Encounter for Removal of Sutures ?- Incision site at the right cheek preauricular is clean, dry and intact ?- Wound cleansed, sutures removed, wound cleansed and steri strips applied.  ?- Discussed pathology results showing BCC margins free  ?- Patient advised to keep steri-strips dry until they fall off. ?- Scars remodel for a full year. ?- Once steri-strips fall off, patient can apply over-the-counter silicone scar cream each night to help with scar remodeling if desired. ?- Patient advised to call with any concerns or if they notice any new or changing lesions. ? ?Return for Follow up as scheduled. ? ?I, Ashok Cordia, CMA, am acting as scribe for Sarina Ser, MD . ?Documentation: I have reviewed the above documentation for accuracy and completeness, and I agree with the above. ? ?Sarina Ser, MD ? ?

## 2022-09-03 IMAGING — CT CT L SPINE W/O CM
3 series · 12 of 35 positions shown, 14 images · non-contrast
Comparison: MRI 02/27/2013

CLINICAL DATA: Low back pain beginning yesterday after weight
lifting.

EXAM:
CT LUMBAR SPINE WITHOUT CONTRAST
TECHNIQUE: Multidetector CT imaging of the lumbar spine was performed without
intravenous contrast administration. Multiplanar CT image
reconstructions were also generated.

[Series 3: l spine soft · axial · 0.30mm/px · z∈[-819,-653]mm · 4 of 121 slices shown, 5 images]
[im 19/121  soft-tissue]
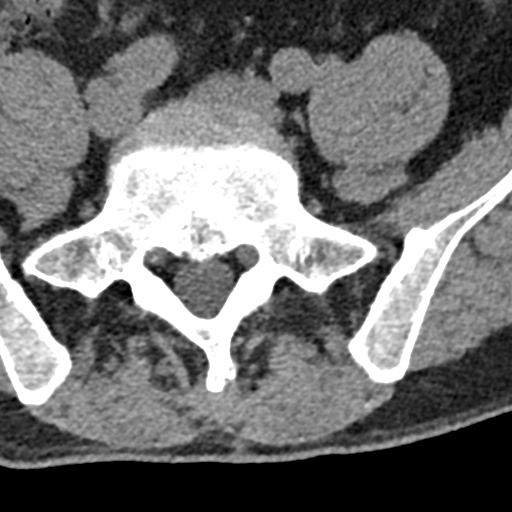
[im 19/121  bone]
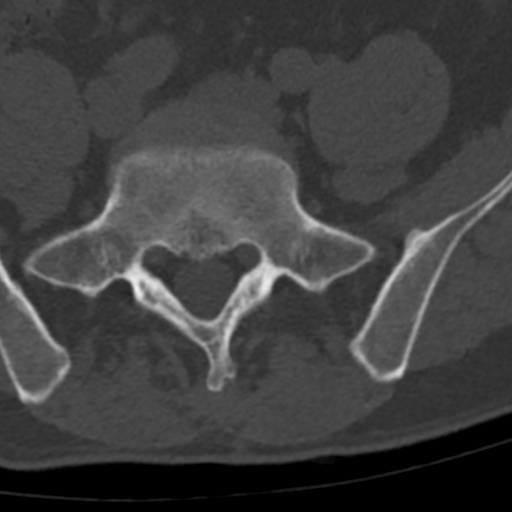
[im 47/121  bone]
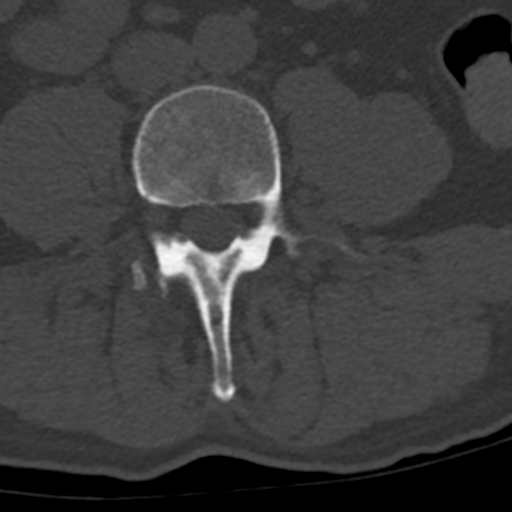
[im 74/121  bone]
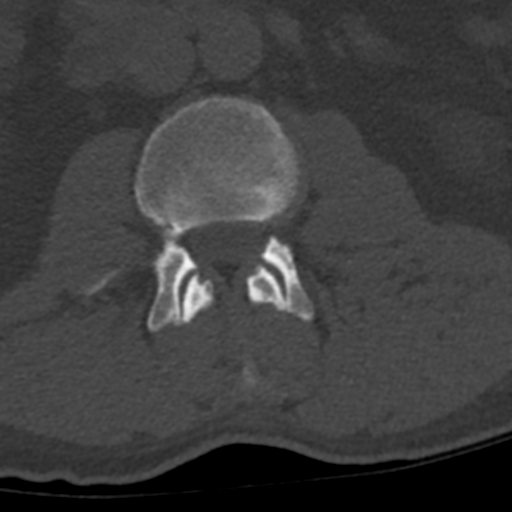
[im 102/121  bone]
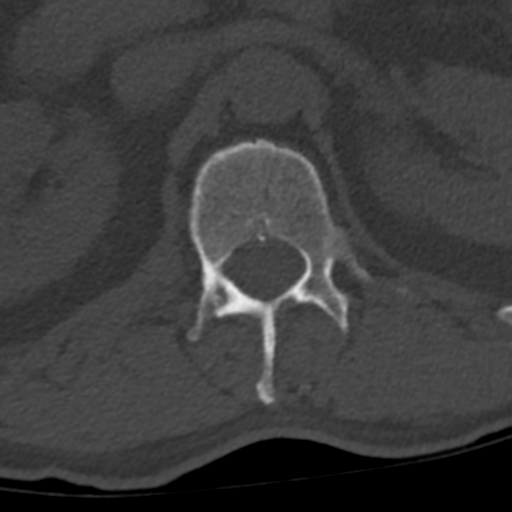

[Series 6: sagittal st · sagittal · 0.32mm/px · 5 of 74 slices shown, 6 images]
[im 25/74  bone]
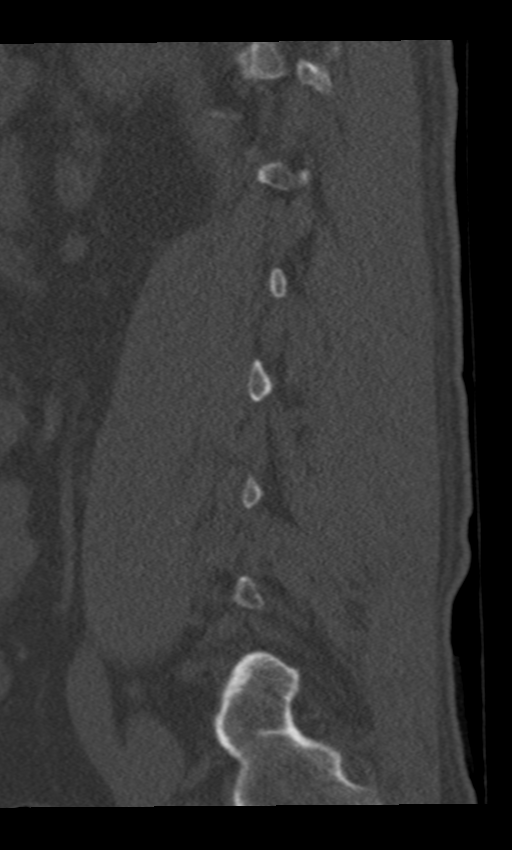
[im 31/74  bone]
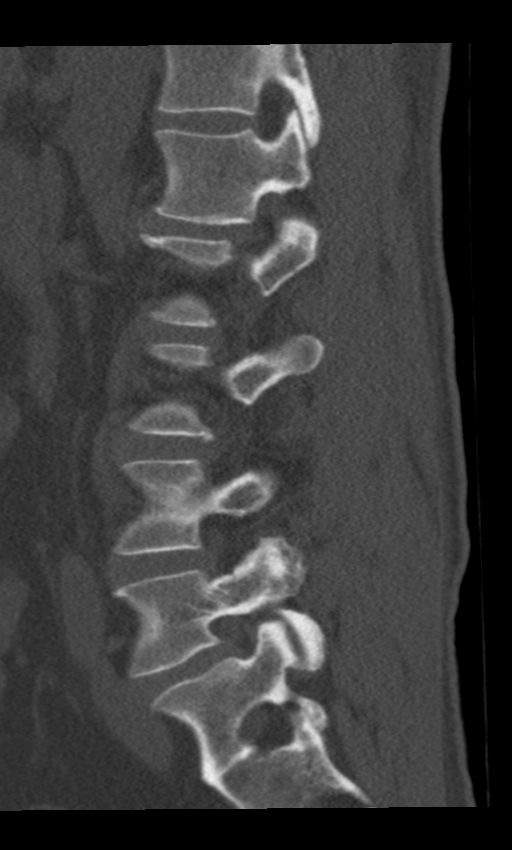
[im 37/74  soft-tissue]
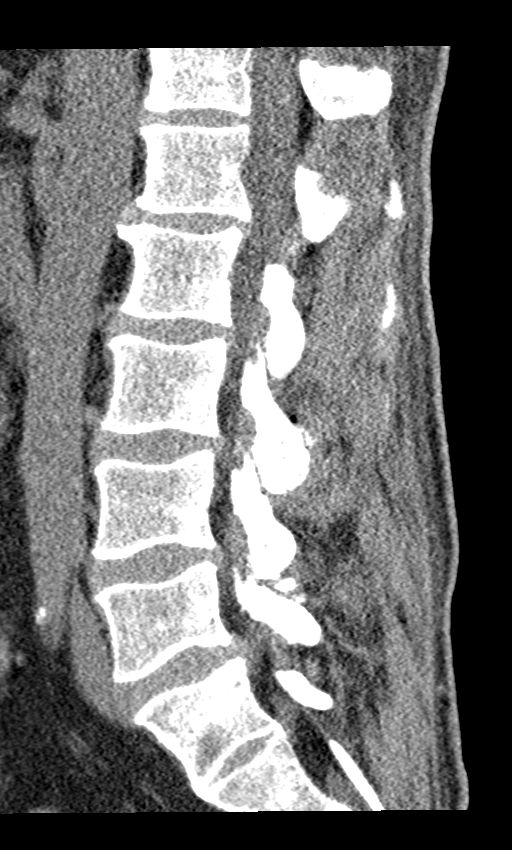
[im 37/74  bone]
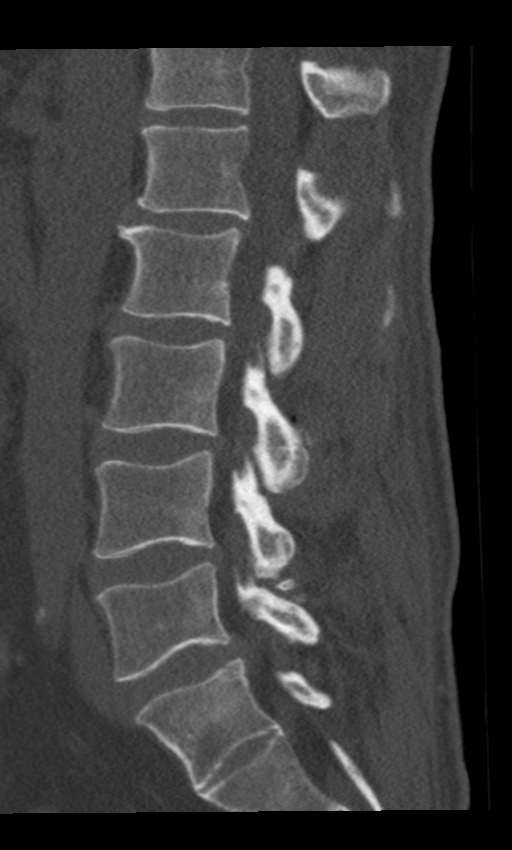
[im 43/74  bone]
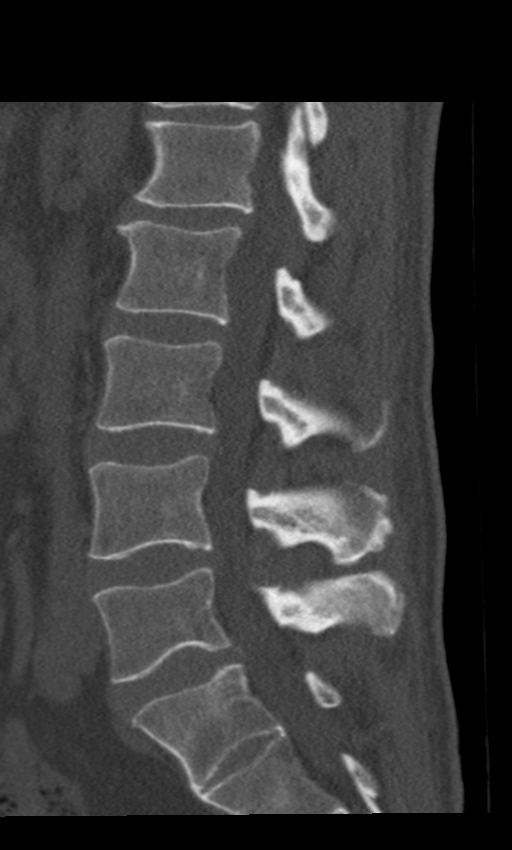
[im 49/74  bone]
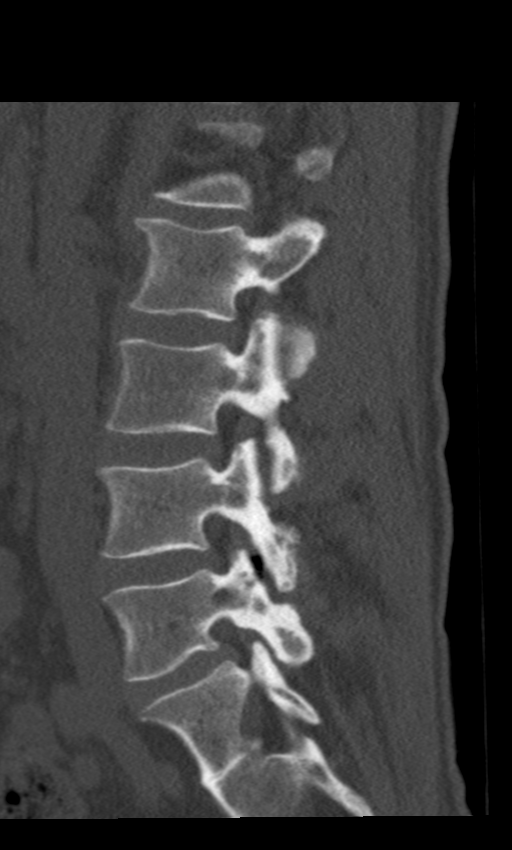

[Series 7: coronal bone · coronal · 0.40mm/px · 3 of 79 slices shown]
[im 16/79  bone]
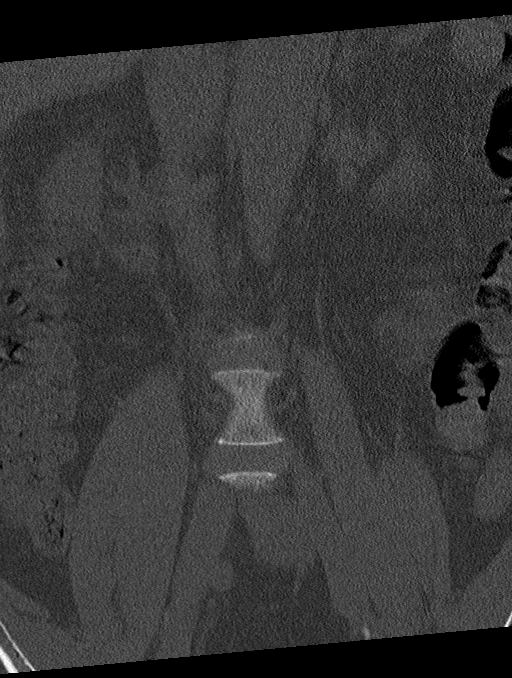
[im 32/79  bone]
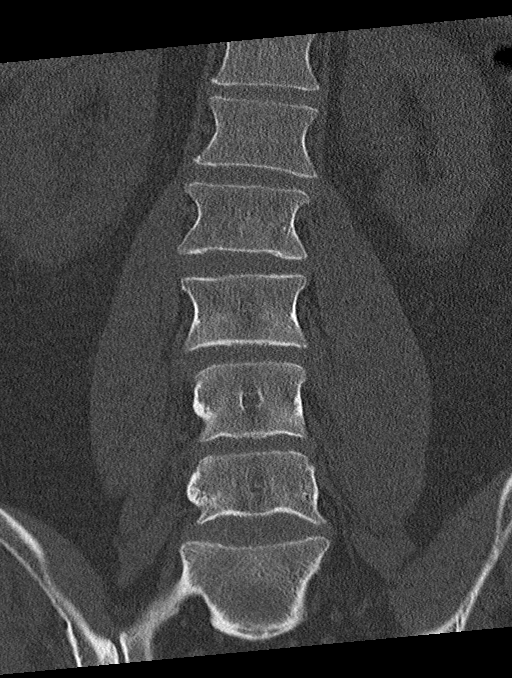
[im 47/79  bone]
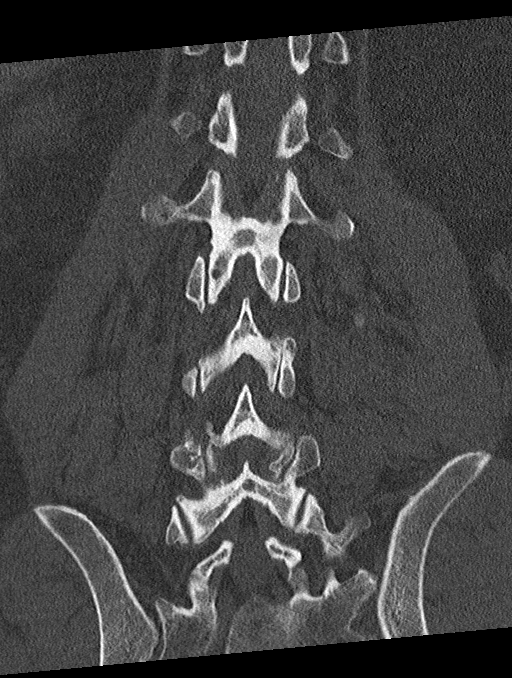

[12 of 35 positions shown; findings below may reference images not displayed]

FINDINGS: Segmentation: 5 lumbar type vertebral bodies as numbered previously.

Alignment: Minimal curvature convex to the right. No antero or
retrolisthesis.

Vertebrae: No fracture or primary bone lesion.

Paraspinal and other soft tissues: Negative

Disc levels: L1-2: Minimal disc bulge.  No stenosis.

L2-3: Minimal disc bulge.  Mild facet degeneration.  No stenosis.

L3-4: Mild disc bulge. Bilateral facet degeneration. Mild narrowing
of the lateral recesses but without visible neural compression.

L4-5: Moderate disc bulge. Bilateral facet arthropathy with
hypertrophic change. Stenosis of both lateral recesses that could
possibly cause neural compression.

L5-S1: Mild disc bulge. Mild facet hypertrophy. No compressive
narrowing of the canal or foramina.
IMPRESSION: 1. No acute or traumatic finding.
2. Lower lumbar degenerative disc disease and degenerative facet
disease. Findings are most pronounced at L4-5 where there is
narrowing of the lateral recesses that could possibly cause neural
compression. The facet arthropathy is most pronounced at this level,
which could contribute to back pain or referred facet syndrome pain.
There could also be anterolisthesis with standing or flexion.
3. The facet arthropathy is also potentially significant
particularly at L3-4 and possibly at L2-3 and L5-S1.

## 2023-02-10 ENCOUNTER — Ambulatory Visit: Payer: BC Managed Care – PPO | Admitting: Dermatology

## 2023-02-24 ENCOUNTER — Ambulatory Visit: Payer: BC Managed Care – PPO | Admitting: Dermatology

## 2023-02-24 VITALS — BP 134/85 | HR 69

## 2023-02-24 DIAGNOSIS — D229 Melanocytic nevi, unspecified: Secondary | ICD-10-CM | POA: Diagnosis not present

## 2023-02-24 DIAGNOSIS — Z7189 Other specified counseling: Secondary | ICD-10-CM

## 2023-02-24 DIAGNOSIS — L570 Actinic keratosis: Secondary | ICD-10-CM | POA: Diagnosis not present

## 2023-02-24 DIAGNOSIS — L578 Other skin changes due to chronic exposure to nonionizing radiation: Secondary | ICD-10-CM | POA: Diagnosis not present

## 2023-02-24 DIAGNOSIS — Z1283 Encounter for screening for malignant neoplasm of skin: Secondary | ICD-10-CM | POA: Diagnosis not present

## 2023-02-24 DIAGNOSIS — L821 Other seborrheic keratosis: Secondary | ICD-10-CM

## 2023-02-24 DIAGNOSIS — L719 Rosacea, unspecified: Secondary | ICD-10-CM | POA: Diagnosis not present

## 2023-02-24 DIAGNOSIS — Z85828 Personal history of other malignant neoplasm of skin: Secondary | ICD-10-CM

## 2023-02-24 DIAGNOSIS — L814 Other melanin hyperpigmentation: Secondary | ICD-10-CM

## 2023-02-24 NOTE — Progress Notes (Signed)
Follow-Up Visit   Subjective  Darren Reyes is a 62 y.o. male who presents for the following: Annual Exam (Hx BCC, [place] ). The patient presents for Total-Body Skin Exam (TBSE) for skin cancer screening and mole check.  The patient has spots, moles and lesions to be evaluated, some may be new or changing and the patient has concerns that these could be cancer.  The following portions of the chart were reviewed this encounter and updated as appropriate:   Tobacco  Allergies  Meds  Problems  Med Hx  Surg Hx  Fam Hx     Review of Systems:  No other skin or systemic complaints except as noted in HPI or Assessment and Plan.  Objective  Well appearing patient in no apparent distress; mood and affect are within normal limits.  A full examination was performed including scalp, head, eyes, ears, nose, lips, neck, chest, axillae, abdomen, back, buttocks, bilateral upper extremities, bilateral lower extremities, hands, feet, fingers, toes, fingernails, and toenails. All findings within normal limits unless otherwise noted below.  face x 4 (4) Erythematous thin papules/macules with gritty scale.   Face Erythema.   Assessment & Plan  AK (actinic keratosis) (4) face x 4  Destruction of lesion - face x 4 Complexity: simple   Destruction method: cryotherapy   Informed consent: discussed and consent obtained   Timeout:  patient name, date of birth, surgical site, and procedure verified Lesion destroyed using liquid nitrogen: Yes   Region frozen until ice ball extended beyond lesion: Yes   Outcome: patient tolerated procedure well with no complications   Post-procedure details: wound care instructions given    Rosacea Face  Rosacea is a chronic progressive skin condition usually affecting the face of adults, causing redness and/or acne bumps. It is treatable but not curable. It sometimes affects the eyes (ocular rosacea) as well. It may respond to topical and/or systemic medication and  can flare with stress, sun exposure, alcohol, exercise, topical steroids (including hydrocortisone/cortisone 10) and some foods.  Daily application of broad spectrum spf 30+ sunscreen to face is recommended to reduce flares.  Counseling for BBL / IPL / Laser and Coordination of Care Discussed the treatment option of Broad Band Light (BBL) /Intense Pulsed Light (IPL)/ Laser for skin discoloration, including brown spots and redness.  Typically we recommend at least 1-3 treatment sessions about 5-8 weeks apart for best results.  Cannot have tanned skin when BBL performed, and regular use of sunscreen is advised after the procedure to help maintain results. The patient's condition may also require "maintenance treatments" in the future.  The fee for BBL / laser treatments is $350 per treatment session for the whole face.  A fee can be quoted for other parts of the body.  Insurance typically does not pay for BBL/laser treatments and therefore the fee is an out-of-pocket cost.   Lentigines - Scattered tan macules - Due to sun exposure - Benign-appearing, observe - Recommend daily broad spectrum sunscreen SPF 30+ to sun-exposed areas, reapply every 2 hours as needed. - Call for any changes  Seborrheic Keratoses - Stuck-on, waxy, tan-brown papules and/or plaques  - Benign-appearing - Discussed benign etiology and prognosis. - Observe - Call for any changes  Melanocytic Nevi - Tan-brown and/or pink-flesh-colored symmetric macules and papules - Benign appearing on exam today - Observation - Call clinic for new or changing moles - Recommend daily use of broad spectrum spf 30+ sunscreen to sun-exposed areas.   Hemangiomas - Red  papules - Discussed benign nature - Observe - Call for any changes  Actinic Damage - Chronic condition, secondary to cumulative UV/sun exposure - diffuse scaly erythematous macules with underlying dyspigmentation - Recommend daily broad spectrum sunscreen SPF 30+ to  sun-exposed areas, reapply every 2 hours as needed.  - Staying in the shade or wearing long sleeves, sun glasses (UVA+UVB protection) and wide brim hats (4-inch brim around the entire circumference of the hat) are also recommended for sun protection.  - Call for new or changing lesions.  History of Basal Cell Carcinoma of the Skin - No evidence of recurrence today - Recommend regular full body skin exams - Recommend daily broad spectrum sunscreen SPF 30+ to sun-exposed areas, reapply every 2 hours as needed.  - Call if any new or changing lesions are noted between office visits  Skin cancer screening performed today.  Return in about 1 year (around 02/24/2024) for TBSE.  Luther Redo, CMA, am acting as scribe for Sarina Ser, MD . Documentation: I have reviewed the above documentation for accuracy and completeness, and I agree with the above.  Sarina Ser, MD

## 2023-02-24 NOTE — Patient Instructions (Signed)
Due to recent changes in healthcare laws, you may see results of your pathology and/or laboratory studies on MyChart before the doctors have had a chance to review them. We understand that in some cases there may be results that are confusing or concerning to you. Please understand that not all results are received at the same time and often the doctors may need to interpret multiple results in order to provide you with the best plan of care or course of treatment. Therefore, we ask that you please give us 2 business days to thoroughly review all your results before contacting the office for clarification. Should we see a critical lab result, you will be contacted sooner.   If You Need Anything After Your Visit  If you have any questions or concerns for your doctor, please call our main line at [phone] and press option 4 to reach your doctor's medical assistant. If no one answers, please leave a voicemail as directed and we will return your call as soon as possible. Messages left after 4 pm will be answered the following business day.   You may also send us a message via MyChart. We typically respond to MyChart messages within 1-2 business days.  For prescription refills, please ask your pharmacy to contact our office. Our fax number is [phone].  If you have an urgent issue when the clinic is closed that cannot wait until the next business day, you can page your doctor at the number below.    Please note that while we do our best to be available for urgent issues outside of office hours, we are not available 24/7.   If you have an urgent issue and are unable to reach us, you may choose to seek medical care at your doctor's office, retail clinic, urgent care center, or emergency room.  If you have a medical emergency, please immediately call 911 or go to the emergency department.  Pager Numbers  - Dr. Kowalski: [phone]  - Dr. Moye: [phone]  - Dr. Stewart:  [phone]  In the event of inclement weather, please call our main line at [phone] for an update on the status of any delays or closures.  Dermatology Medication Tips: Please keep the boxes that topical medications come in in order to help keep track of the instructions about where and how to use these. Pharmacies typically print the medication instructions only on the boxes and not directly on the medication tubes.   If your medication is too expensive, please contact our office at [phone] option 4 or send us a message through MyChart.   We are unable to tell what your co-pay for medications will be in advance as this is different depending on your insurance coverage. However, we may be able to find a substitute medication at lower cost or fill out paperwork to get insurance to cover a needed medication.   If a prior authorization is required to get your medication covered by your insurance company, please allow us 1-2 business days to complete this process.  Drug prices often vary depending on where the prescription is filled and some pharmacies may offer cheaper prices.  The website www.goodrx.com contains coupons for medications through different pharmacies. The prices here do not account for what the cost may be with help from insurance (it may be cheaper with your insurance), but the website can give you the price if you did not use any insurance.  - You can print the associated coupon and take it with   your prescription to the pharmacy.  - You may also stop by our office during regular business hours and pick up a GoodRx coupon card.  - If you need your prescription sent electronically to a different pharmacy, notify our office through [facility] MyChart or by phone at [phone] option 4.     Si Usted Necesita Algo Despus de Su Visita  Tambin puede enviarnos un mensaje a travs de MyChart. Por lo general respondemos a los mensajes de MyChart en el transcurso de 1 a 2  das hbiles.  Para renovar recetas, por favor pida a su farmacia que se ponga en contacto con nuestra oficina. Nuestro nmero de fax es el [phone].  Si tiene un asunto urgente cuando la clnica est cerrada y que no puede esperar hasta el siguiente da hbil, puede llamar/localizar a su doctor(a) al nmero que aparece a continuacin.   Por favor, tenga en cuenta que aunque hacemos todo lo posible para estar disponibles para asuntos urgentes fuera del horario de oficina, no estamos disponibles las 24 horas del da, los 7 das de la semana.   Si tiene un problema urgente y no puede comunicarse con nosotros, puede optar por buscar atencin mdica  en el consultorio de su doctor(a), en una clnica privada, en un centro de atencin urgente o en una sala de emergencias.  Si tiene una emergencia mdica, por favor llame inmediatamente al 911 o vaya a la sala de emergencias.  Nmeros de bper  - Dr. Kowalski: [phone]  - Dra. Moye: [phone]  - Dra. Stewart: [phone]  En caso de inclemencias del tiempo, por favor llame a nuestra lnea principal al [phone] para una actualizacin sobre el estado de cualquier retraso o cierre.  Consejos para la medicacin en dermatologa: Por favor, guarde las cajas en las que vienen los medicamentos de uso tpico para ayudarle a seguir las instrucciones sobre dnde y cmo usarlos. Las farmacias generalmente imprimen las instrucciones del medicamento slo en las cajas y no directamente en los tubos del medicamento.   Si su medicamento es muy caro, por favor, pngase en contacto con nuestra oficina llamando al [phone] y presione la opcin 4 o envenos un mensaje a travs de MyChart.   No podemos decirle cul ser su copago por los medicamentos por adelantado ya que esto es diferente dependiendo de la cobertura de su seguro. Sin embargo, es posible que podamos encontrar un medicamento sustituto a menor costo o llenar un formulario para que el  seguro cubra el medicamento que se considera necesario.   Si se requiere una autorizacin previa para que su compaa de seguros cubra su medicamento, por favor permtanos de 1 a 2 das hbiles para completar este proceso.  Los precios de los medicamentos varan con frecuencia dependiendo del lugar de dnde se surte la receta y alguna farmacias pueden ofrecer precios ms baratos.  El sitio web www.goodrx.com tiene cupones para medicamentos de diferentes farmacias. Los precios aqu no tienen en cuenta lo que podra costar con la ayuda del seguro (puede ser ms barato con su seguro), pero el sitio web puede darle el precio si no utiliz ningn seguro.  - Puede imprimir el cupn correspondiente y llevarlo con su receta a la farmacia.  - Tambin puede pasar por nuestra oficina durante el horario de atencin regular y recoger una tarjeta de cupones de GoodRx.  - Si necesita que su receta se enve electrnicamente a una farmacia diferente, informe a nuestra oficina a travs de MyChart de    o por telfono llamando al [phone] y presione la opcin 4.  

## 2023-03-04 ENCOUNTER — Encounter: Payer: Self-pay | Admitting: Dermatology

## 2024-03-01 ENCOUNTER — Ambulatory Visit: Payer: BC Managed Care – PPO | Admitting: Dermatology

## 2024-03-05 ENCOUNTER — Ambulatory Visit: Admitting: Dermatology

## 2024-03-05 ENCOUNTER — Encounter: Payer: Self-pay | Admitting: Dermatology

## 2024-03-05 DIAGNOSIS — Z1283 Encounter for screening for malignant neoplasm of skin: Secondary | ICD-10-CM

## 2024-03-05 DIAGNOSIS — Z85828 Personal history of other malignant neoplasm of skin: Secondary | ICD-10-CM

## 2024-03-05 DIAGNOSIS — L820 Inflamed seborrheic keratosis: Secondary | ICD-10-CM

## 2024-03-05 DIAGNOSIS — L578 Other skin changes due to chronic exposure to nonionizing radiation: Secondary | ICD-10-CM | POA: Diagnosis not present

## 2024-03-05 DIAGNOSIS — W908XXA Exposure to other nonionizing radiation, initial encounter: Secondary | ICD-10-CM

## 2024-03-05 DIAGNOSIS — L814 Other melanin hyperpigmentation: Secondary | ICD-10-CM

## 2024-03-05 DIAGNOSIS — L821 Other seborrheic keratosis: Secondary | ICD-10-CM

## 2024-03-05 DIAGNOSIS — Z872 Personal history of diseases of the skin and subcutaneous tissue: Secondary | ICD-10-CM

## 2024-03-05 DIAGNOSIS — D229 Melanocytic nevi, unspecified: Secondary | ICD-10-CM

## 2024-03-05 DIAGNOSIS — D1801 Hemangioma of skin and subcutaneous tissue: Secondary | ICD-10-CM

## 2024-03-05 NOTE — Patient Instructions (Addendum)

## 2024-03-05 NOTE — Progress Notes (Signed)
   Follow-Up Visit   Subjective  Darren Reyes is a 63 y.o. male who presents for the following: Skin Cancer Screening and Full Body Skin Exam Hx of bcc, hx of aks, hx of isks  The patient presents for Total-Body Skin Exam (TBSE) for skin cancer screening and mole check. The patient has spots, moles and lesions to be evaluated, some may be new or changing and the patient may have concern these could be cancer.  The following portions of the chart were reviewed this encounter and updated as appropriate: medications, allergies, medical history  Review of Systems:  No other skin or systemic complaints except as noted in HPI or Assessment and Plan.  Objective  Well appearing patient in no apparent distress; mood and affect are within normal limits.  A full examination was performed including scalp, head, eyes, ears, nose, lips, neck, chest, axillae, abdomen, back, buttocks, bilateral upper extremities, bilateral lower extremities, hands, feet, fingers, toes, fingernails, and toenails. All findings within normal limits unless otherwise noted below.   Relevant physical exam findings are noted in the Assessment and Plan.  left forearm x 1 Erythematous stuck-on, waxy papule or plaque  Assessment & Plan   SKIN CANCER SCREENING PERFORMED TODAY.  ACTINIC DAMAGE - Chronic condition, secondary to cumulative UV/sun exposure - diffuse scaly erythematous macules with underlying dyspigmentation - Recommend daily broad spectrum sunscreen SPF 30+ to sun-exposed areas, reapply every 2 hours as needed.  - Staying in the shade or wearing long sleeves, sun glasses (UVA+UVB protection) and wide brim hats (4-inch brim around the entire circumference of the hat) are also recommended for sun protection.  - Call for new or changing lesions.  LENTIGINES, SEBORRHEIC KERATOSES, HEMANGIOMAS - Benign normal skin lesions - Benign-appearing - Call for any changes  MELANOCYTIC NEVI - Tan-brown and/or  pink-flesh-colored symmetric macules and papules - Benign appearing on exam today - Observation - Call clinic for new or changing moles - Recommend daily use of broad spectrum spf 30+ sunscreen to sun-exposed areas.   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  - Call if any new or changing lesions are noted between office visits  INFLAMED SEBORRHEIC KERATOSIS left forearm x 1 Symptomatic, irritating, patient would like treated. Destruction of lesion - left forearm x 1 Complexity: simple   Destruction method: cryotherapy   Informed consent: discussed and consent obtained   Timeout:  patient name, date of birth, surgical site, and procedure verified Lesion destroyed using liquid nitrogen: Yes   Region frozen until ice ball extended beyond lesion: Yes   Outcome: patient tolerated procedure well with no complications   Post-procedure details: wound care instructions given   Return in about 1 year (around 03/05/2025) for TBSE.  IAsher Muir, CMA, am acting as scribe for Armida Sans, MD.   Documentation: I have reviewed the above documentation for accuracy and completeness, and I agree with the above.  Armida Sans, MD

## 2025-03-06 ENCOUNTER — Ambulatory Visit: Admitting: Dermatology
